# Patient Record
Sex: Female | Born: 1935 | Race: White | Hispanic: No
Health system: Southern US, Community
[De-identification: ages and names within clinical notes are randomized; demographics above are authoritative.]

---

## 2002-03-19 ENCOUNTER — Emergency Department (HOSPITAL_COMMUNITY): Admission: EM | Admit: 2002-03-19 | Discharge: 2002-03-19 | Payer: Self-pay | Admitting: *Deleted

## 2002-03-19 ENCOUNTER — Encounter: Payer: Self-pay | Admitting: *Deleted

## 2003-04-26 ENCOUNTER — Ambulatory Visit (HOSPITAL_COMMUNITY): Admission: RE | Admit: 2003-04-26 | Discharge: 2003-04-26 | Payer: Self-pay | Admitting: Family Medicine

## 2003-04-26 ENCOUNTER — Encounter: Payer: Self-pay | Admitting: Family Medicine

## 2003-04-27 ENCOUNTER — Encounter: Payer: Self-pay | Admitting: Family Medicine

## 2003-04-27 ENCOUNTER — Ambulatory Visit (HOSPITAL_COMMUNITY): Admission: RE | Admit: 2003-04-27 | Discharge: 2003-04-27 | Payer: Self-pay | Admitting: Family Medicine

## 2003-05-07 ENCOUNTER — Ambulatory Visit (HOSPITAL_COMMUNITY): Admission: RE | Admit: 2003-05-07 | Discharge: 2003-05-07 | Payer: Self-pay | Admitting: Family Medicine

## 2003-05-07 ENCOUNTER — Encounter: Payer: Self-pay | Admitting: Family Medicine

## 2003-05-25 ENCOUNTER — Observation Stay (HOSPITAL_COMMUNITY): Admission: RE | Admit: 2003-05-25 | Discharge: 2003-05-26 | Payer: Self-pay | Admitting: General Surgery

## 2003-06-17 ENCOUNTER — Encounter: Payer: Self-pay | Admitting: General Surgery

## 2003-06-17 ENCOUNTER — Ambulatory Visit (HOSPITAL_COMMUNITY): Admission: RE | Admit: 2003-06-17 | Discharge: 2003-06-17 | Payer: Self-pay | Admitting: General Surgery

## 2003-11-09 ENCOUNTER — Ambulatory Visit (HOSPITAL_COMMUNITY): Admission: RE | Admit: 2003-11-09 | Discharge: 2003-11-09 | Payer: Self-pay | Admitting: Family Medicine

## 2004-03-07 ENCOUNTER — Ambulatory Visit (HOSPITAL_COMMUNITY): Admission: RE | Admit: 2004-03-07 | Discharge: 2004-03-07 | Payer: Self-pay | Admitting: Family Medicine

## 2004-05-25 ENCOUNTER — Ambulatory Visit (HOSPITAL_COMMUNITY): Admission: RE | Admit: 2004-05-25 | Discharge: 2004-05-25 | Payer: Self-pay | Admitting: Family Medicine

## 2005-06-26 ENCOUNTER — Ambulatory Visit (HOSPITAL_COMMUNITY): Admission: RE | Admit: 2005-06-26 | Discharge: 2005-06-26 | Payer: Self-pay | Admitting: Family Medicine

## 2005-08-17 ENCOUNTER — Ambulatory Visit (HOSPITAL_COMMUNITY): Admission: RE | Admit: 2005-08-17 | Discharge: 2005-08-17 | Payer: Self-pay | Admitting: Family Medicine

## 2005-10-17 ENCOUNTER — Ambulatory Visit (HOSPITAL_COMMUNITY): Admission: RE | Admit: 2005-10-17 | Discharge: 2005-10-17 | Payer: Self-pay | Admitting: Family Medicine

## 2006-08-27 ENCOUNTER — Encounter: Admission: RE | Admit: 2006-08-27 | Discharge: 2006-08-27 | Payer: Self-pay | Admitting: Family Medicine

## 2007-05-08 ENCOUNTER — Ambulatory Visit (HOSPITAL_COMMUNITY): Admission: RE | Admit: 2007-05-08 | Discharge: 2007-05-08 | Payer: Self-pay | Admitting: Family Medicine

## 2007-06-18 ENCOUNTER — Ambulatory Visit: Payer: Self-pay | Admitting: Internal Medicine

## 2007-06-18 ENCOUNTER — Ambulatory Visit (HOSPITAL_COMMUNITY): Admission: RE | Admit: 2007-06-18 | Discharge: 2007-06-18 | Payer: Self-pay | Admitting: Internal Medicine

## 2008-06-02 ENCOUNTER — Ambulatory Visit (HOSPITAL_COMMUNITY): Admission: RE | Admit: 2008-06-02 | Discharge: 2008-06-02 | Payer: Self-pay | Admitting: Family Medicine

## 2009-11-03 ENCOUNTER — Ambulatory Visit (HOSPITAL_COMMUNITY): Admission: RE | Admit: 2009-11-03 | Discharge: 2009-11-03 | Payer: Self-pay | Admitting: Family Medicine

## 2010-05-30 ENCOUNTER — Ambulatory Visit (HOSPITAL_COMMUNITY): Admission: RE | Admit: 2010-05-30 | Discharge: 2010-05-30 | Payer: Self-pay | Admitting: Family Medicine

## 2011-01-30 NOTE — Op Note (Signed)
NAME:  Michaela Warren, Michaela Warren                ACCOUNT NO.:  [id number]   MEDICAL RECORD NO.:  [id number]          PATIENT TYPE:  AMB   LOCATION:  DAY                           FACILITY:  [facility]   PHYSICIAN:  R. Roetta Sessions, M.D. DATE OF BIRTH:  1932-06-27   DATE OF PROCEDURE:  06/18/2007  DATE OF DISCHARGE:                               OPERATIVE REPORT   PROCEDURE:  Screening colonoscopy.   INDICATIONS FOR PROCEDURE:  The patient is a 77 year old Caucasian  female with no lower GI tract symptoms who was sent over courtesy of Dr.  Renard Matter for colon cancer screening.  Her last colonoscopy was back in  the 18s in [place] for screening purposes, without apparent  significant findings.  She has no lower GI tract symptoms.  There is no  family history of colorectal neoplasia.  Colonoscopy is now being done  as a screening maneuver.  This approach has been discussed with the  patient at length.  Potential risks, benefits, and alternatives have  been reviewed.  Questions answered.  She is agreeable.  Please see  documentation in the medical record.   PROCEDURE NOTE:  O2 saturation, blood pressure, pulse, and respirations  were monitored throughout the entire procedure.   CONSCIOUS SEDATION:  Versed 5 mg IV, Demerol 100 grams IV in divided  doses.   INSTRUMENTATION:  Pentax video chip system.   FINDINGS:  Digital rectal exam revealed no abnormalities.  Endoscopic  findings:  The prep was adequate.  Colon:  Colonic mucosa was surveyed  from the rectosigmoid junction through the left, transverse, and right  colon to the appendiceal orifice, ileocecal valve, and cecum.  These  structures were well seen and photographed for record.  From this level,  the scope was withdrawn.  All previously mentioned mucosal surfaces were  again seen.  The patient had left sided diverticula.  The remainder of  the colonic mucosa appeared normal.  The scope was pulled down to the  rectum, where thorough  examination of the rectal mucosa including  retroflexed view of the anal verge demonstrated only anal papilla and  some minimal internal hemorrhoids.  The patient tolerated the procedure  well and was reactivated.   ENDOSCOPIC IMPRESSION:  1. Minimal internal hemorrhoids, anal papilla, otherwise normal      rectum.  2. Left-sided diverticula.  The remainder of the colonic mucosa      appeared normal.   RECOMMENDATIONS:  1. Diverticulosis literature provided to [name].  2. Recommend one more repeat screening colonoscopy in 10 years.      Jonathon Bellows, M.D.  Electronically Signed     RMR/MEDQ  D:  06/18/2007  T:  06/19/2007  Job:  [id number]   cc:   Angus G. Renard Matter, MD  Fax: [phone]

## 2011-02-02 NOTE — H&P (Signed)
   NAME:  Michaela Warren, Michaela Warren                          ACCOUNT NO.:  [id number]   MEDICAL RECORD NO.:  [id number]                   PATIENT TYPE:  AMB   LOCATION:  DAY                                  FACILITY:  [facility]   PHYSICIAN:  Jerolyn Shin C. Katrinka Blazing, M.D.                DATE OF BIRTH:  03-06-1932   DATE OF ADMISSION:  DATE OF DISCHARGE:                                HISTORY & PHYSICAL   HISTORY OF PRESENT ILLNESS:  [age]-year-old female with a history of  recurrent abdominal pain, postprandial swelling, epigastric and right upper  quadrant tenderness.  Evaluation revealed small gallstones and multiple  gallbladder polyps.  The patient is scheduled for cholecystectomy.   PAST HISTORY:  The patient has hypertension, osteoarthritis and B12  deficiency.   ALLERGIES:  Allergies are to TYLOX.   MEDICATIONS:  1. Mavik 2 mg daily.  2. Aspirin 81 mg daily.   SURGERY:  Total abdominal hysterectomy with salpingo-oophorectomy.   PHYSICAL EXAMINATION:  VITAL SIGNS:  On exam, blood pressure 160/84, pulse  72, respirations 18, weight 150 pounds.  HEENT:  HEENT is unremarkable.  NECK:  Neck is supple without JVD or bruit.  CHEST:  Chest clear to auscultation.  HEART:  Regular rate and rhythm without murmur, gallop or rub.  ABDOMEN:  Abdomen soft and nontender.  No masses.  EXTREMITIES:  No cyanosis, clubbing or edema.  NEUROLOGIC:  No focal motor, sensory or cerebellar deficit.   IMPRESSION:  1. Chronic cholecystitis, cholelithiasis and gallbladder polyps.  2. Hypertension.   PLAN:  Laparoscopic cholecystectomy.                                                Dirk Dress. Katrinka Blazing, M.D.    LCS/MEDQ  D:  05/24/2003  T:  05/25/2003  Job:  [id number]

## 2011-02-02 NOTE — Discharge Summary (Signed)
   NAME:  Michaela Warren, Michaela Warren                          ACCOUNT NO.:  [id number]   MEDICAL RECORD NO.:  [id number]                   PATIENT TYPE:  OBV   LOCATION:  A309                                 FACILITY:  [facility]   PHYSICIAN:  Dirk Dress. Katrinka Blazing, M.D.                DATE OF BIRTH:  09/16/1932   DATE OF ADMISSION:  05/25/2003  DATE OF DISCHARGE:  05/26/2003                                 DISCHARGE SUMMARY   DISCHARGE DIAGNOSES:  1. Cholelithiasis, cholecystitis.  2. Hypertension.   PROCEDURE:  Laparoscopic cholecystectomy May 25, 2003.   DISPOSITION:  The patient was discharged home in stable satisfactory  condition with plans for followup in the office in one week.   DISCHARGE MEDICATIONS:  1. Tylox 1-2 every four hours as needed for pain.  2. Phenergan 25 mg every four hours as needed for nausea.   SUMMARY:  The patient is a 77 year old female with a history of recurrent  abdominal pain, postprandial swelling, epigastric and right upper quadrant  tenderness.  She was found to have small gallstones and multiple gallbladder  polyps.  She was scheduled for a cholecystectomy.   General physical examination otherwise was unremarkable.  Abdominal exam was  benign.   She was admitted through day surgery and underwent laparoscopic  cholecystectomy on September 7.  She had an uneventful postoperative course,  and was discharged on the early morning of the first postoperative day in  satisfactory condition.     ___________________________________________                                         Dirk Dress Katrinka Blazing, M.D.   LCS/MEDQ  D:  07/18/2003  T:  07/18/2003  Job:  [id number]

## 2011-02-02 NOTE — Op Note (Signed)
NAME:  Michaela Warren, FLINCHUM                          ACCOUNT NO.:  [id number]   MEDICAL RECORD NO.:  [id number]                   PATIENT TYPE:  AMB   LOCATION:  DAY                                  FACILITY:  [facility]   PHYSICIAN:  Jerolyn Shin C. Katrinka Blazing, M.D.                DATE OF BIRTH:  1931-11-08   DATE OF PROCEDURE:  05/25/2003  DATE OF DISCHARGE:                                 OPERATIVE REPORT   PREOPERATIVE DIAGNOSES:  1. Chronic cholecystitis.  2. Gallbladder polyps.   POSTOPERATIVE DIAGNOSES:  1. Chronic cholecystitis.  2. Gallbladder polyps.   PROCEDURE:  Laparoscopic cholecystectomy.   SURGEON:  Dirk Dress. Katrinka Blazing, M.D.   DESCRIPTION OF PROCEDURE:  Under general endotracheal anesthesia the  patient's abdomen was prepped and draped in a sterile field.  The  supraumbilical incision was made in the midline.  An attempted placement of  a Veress needle was made, but I could not get what I felt was satisfactory  insertion of the Veress needle.  The needle was removed and the fascia was  incised in the midline.  There was omentum stuck to the anterior abdominal  wall in the supraumbilical area.  This probably was a previous hysterectomy.  Dissection was carried out until the preperitoneal cavity was visualized.  Stay sutures were placed on the fascia and peritoneum.  A blunt Hasson  obturator was placed and the abdomen was insufflated without difficulty.  The laparoscope was placed.  The patient was noted to have extensive  perihepatic adhesions from chronic inflammation in the past.  The  gallbladder was suspended from the liver, which was elevated tightly against  the anterior abdominal wall.  Under videoscopic guidance a 10 mm port and  two 5 mm ports were placed uneventfully.  Using the hook dissector, the  adhesions between the serosa of the liver and the peritoneum were removed.  The gallbladder was then grasped in position.  The cystic duct was dissected  close to the gallbladder.   It was clipped with four clips and divided.  The  cystic artery had three branches.  Each was clipped with three clips and  divided.  Using electrocautery the gallbladder was then separated from the  infrahepatic bed without difficulty.  Hemostasis was achieved.  The  gallbladder was placed and the EndoCatch device then retrieved uneventfully.  Further irrigation was carried out.  Hemostasis of the bed was found to be  adequate.  There was no evidence of bile leak.  Before CO2 was allowed to  escape from the abdomen, the scope was removed from the supraumbilical area  to the right upper quadrant.  The visualization of the puncture site at the  supraumbilical area revealed that this indeed was omentum and there was no  small bowel or colon involved in this area.  CO2 was then allowed to escape  from the abdomen and the ports were removed.  The supraumbilical area was  closed with multiple sutures of interrupted 0 Dexon.  The skin in the other  incisions was closed with staples.  Dressings were placed.  The patient  tolerated the procedure well.  She was awakened from anesthesia, transferred  to a bed, and taken to the postanesthetic care unit for further monitoring.                                                Dirk Dress. Katrinka Blazing, M.D.    LCS/MEDQ  D:  05/25/2003  T:  05/25/2003  Job:  [id number]

## 2011-02-02 NOTE — Procedures (Signed)
[facility]  Patient:    Michaela Warren, Michaela Warren Visit Number: [id number] MRN: [id number]          Service Type: EMS Location: ED Attending Physician:  Rosalyn Charters Dictated by:   Kari Baars, M.D. Admit Date:  03/19/2002 Discharge Date: 03/19/2002                            EKG Interpretations  PROCEDURE:  Electrocardiogram  PHYSICIAN:  Dr. Kari Baars.  DESCRIPTION OF PROCEDURE:  The rhythm is a sinus rhythm with a rate in the 90s.  There is an intraventricular conduction delay.  There are mild ST-T wave changes which are nonspecific.  IMPRESSION:  Abnormal electrocardiogram. Dictated by:   Kari Baars, M.D. Attending Physician:  Rosalyn Charters DD:  03/20/02 TD:  03/24/02 Job: [id number]

## 2012-02-11 IMAGING — CR DG CHEST 2V
2 series · 2 of 2 positions shown · non-contrast
Comparison: June 02, 2008

CLINICAL DATA: Cough, COPD, former smoker

CHEST - 2 VIEW

[view not recorded (1 of 2)]
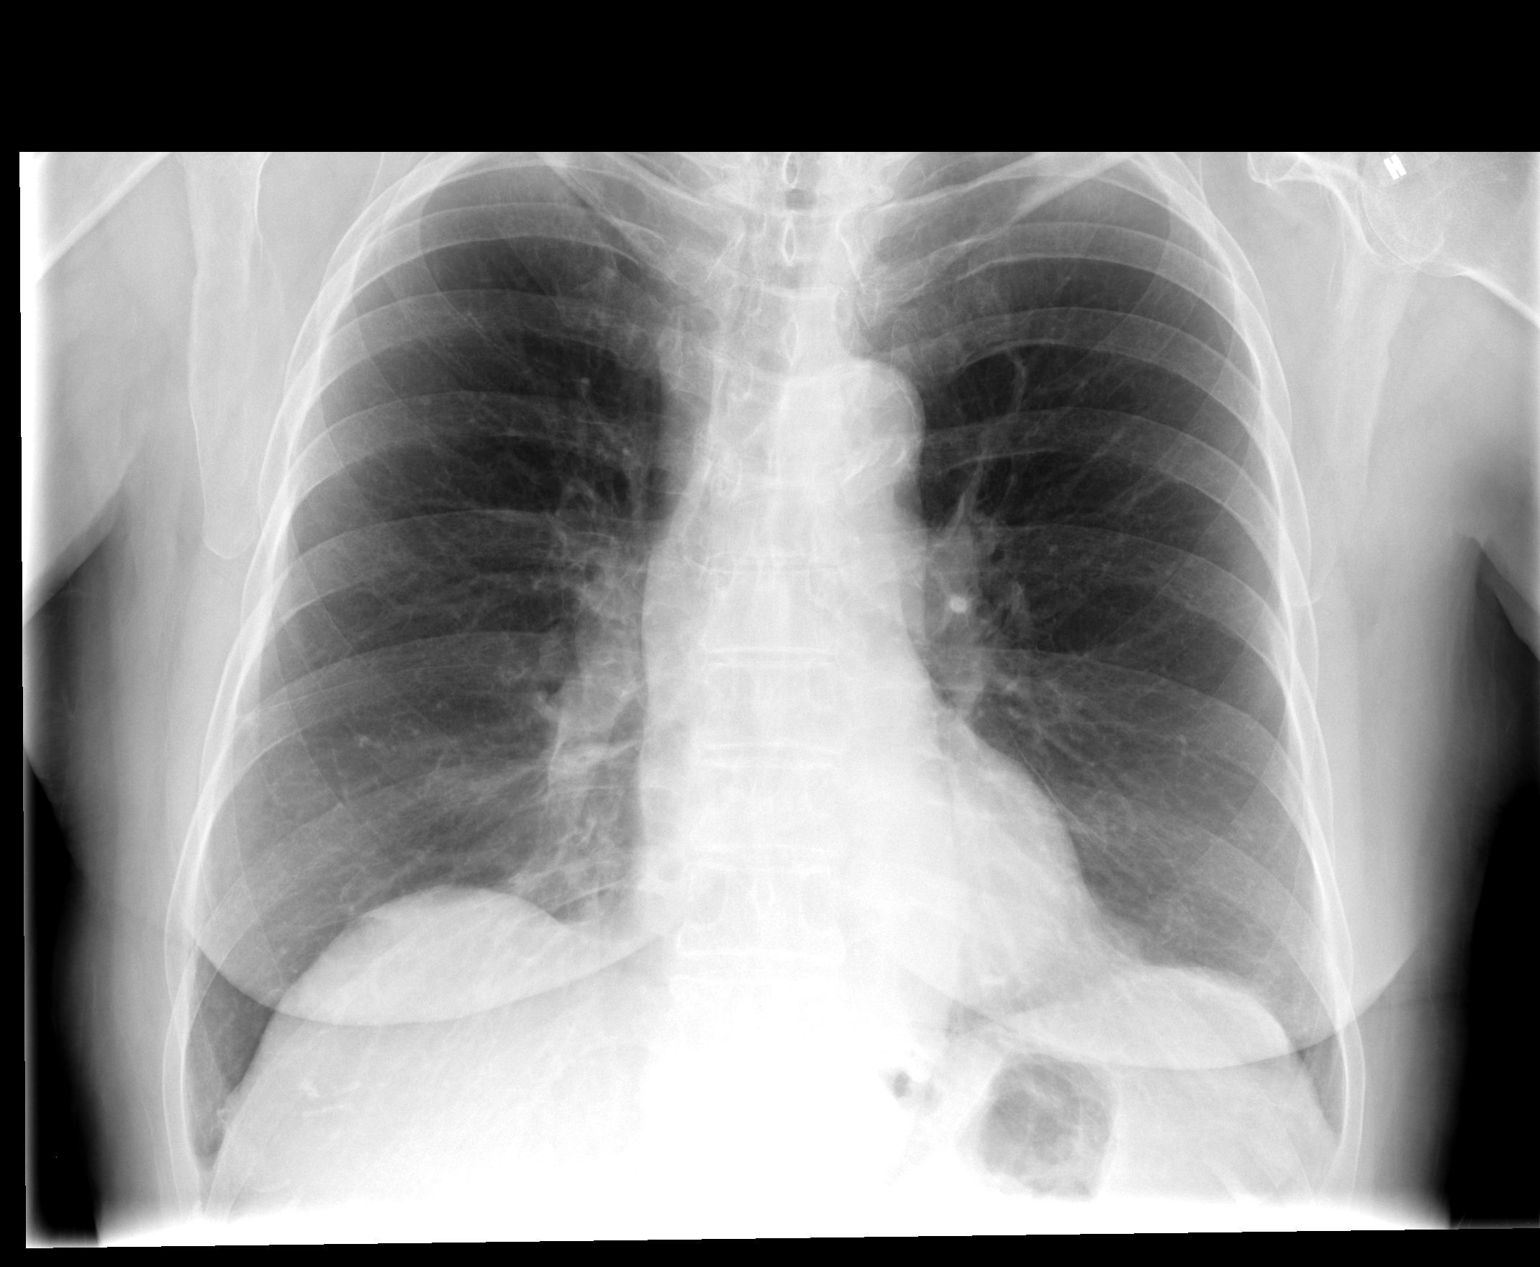

[view not recorded (2 of 2)]
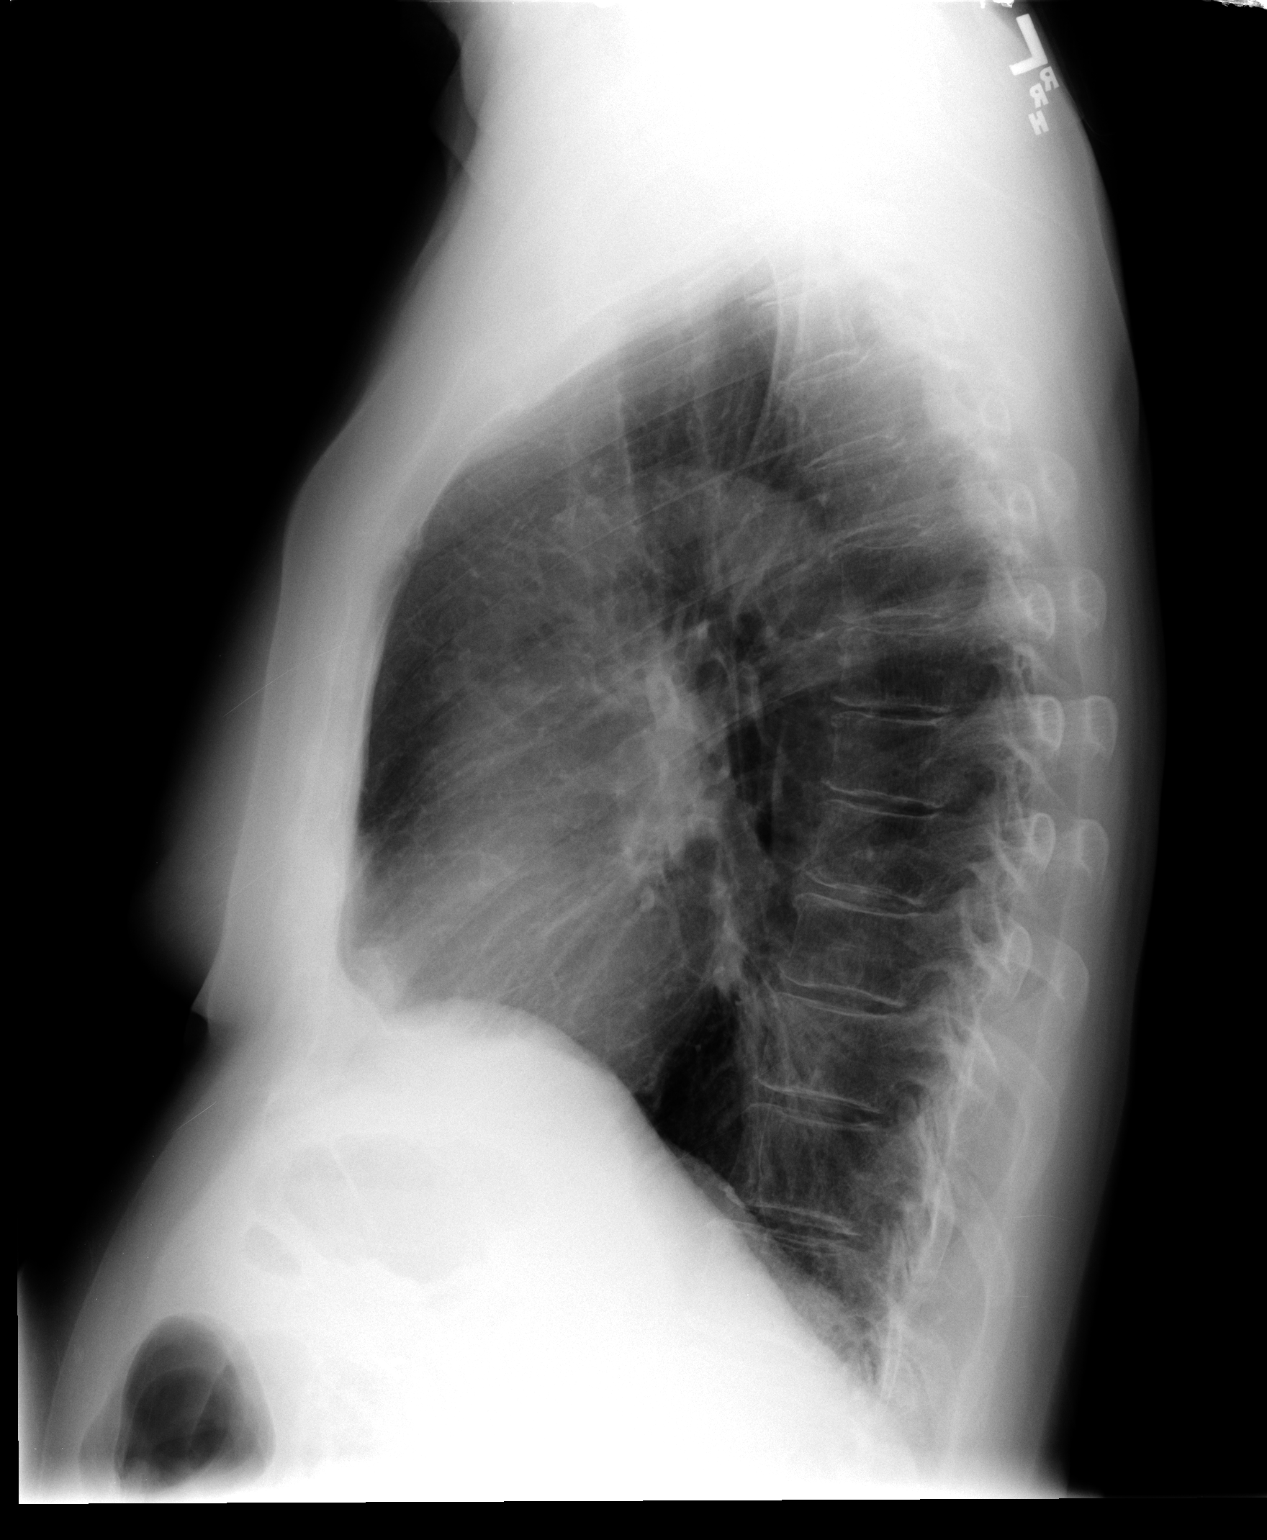

[2 of 2 positions shown; findings below may reference images not displayed]

FINDINGS: The cardiac silhouette, mediastinum, pulmonary
vasculature are within normal limits.  Both lungs are clear.
There is no acute bony abnormality.
IMPRESSION: Stable chest x-ray with no evidence of acute cardiac or pulmonary
process.

## 2017-05-29 ENCOUNTER — Telehealth: Payer: Self-pay | Admitting: Internal Medicine

## 2017-05-29 NOTE — Telephone Encounter (Signed)
Letter mailed to pt.  

## 2017-05-29 NOTE — Telephone Encounter (Signed)
Recall for tcs °
